# Patient Record
Sex: Female | Born: 1980 | Smoking: Never smoker
Health system: Southern US, Community
[De-identification: ages and names within clinical notes are randomized; demographics above are authoritative.]

---

## 2013-02-26 ENCOUNTER — Other Ambulatory Visit (HOSPITAL_COMMUNITY)
Admission: RE | Admit: 2013-02-26 | Discharge: 2013-02-26 | Disposition: A | Discharge status: Home / Self Care | Payer: BC Managed Care – PPO | Source: Ambulatory Visit | Attending: Obstetrics and Gynecology | Admitting: Obstetrics and Gynecology

## 2013-02-26 DIAGNOSIS — Z1151 Encounter for screening for human papillomavirus (HPV): Secondary | ICD-10-CM | POA: Insufficient documentation

## 2013-02-26 DIAGNOSIS — Z124 Encounter for screening for malignant neoplasm of cervix: Secondary | ICD-10-CM | POA: Insufficient documentation

## 2013-02-26 DIAGNOSIS — R87810 Cervical high risk human papillomavirus (HPV) DNA test positive: Secondary | ICD-10-CM | POA: Insufficient documentation

## 2014-03-08 ENCOUNTER — Other Ambulatory Visit (HOSPITAL_COMMUNITY)
Admission: RE | Admit: 2014-03-08 | Discharge: 2014-03-08 | Disposition: A | Discharge status: Home / Self Care | Payer: BLUE CROSS/BLUE SHIELD | Source: Ambulatory Visit | Attending: Obstetrics and Gynecology | Admitting: Obstetrics and Gynecology

## 2014-03-08 ENCOUNTER — Other Ambulatory Visit: Payer: Self-pay | Admitting: Obstetrics and Gynecology

## 2014-03-08 DIAGNOSIS — Z1151 Encounter for screening for human papillomavirus (HPV): Secondary | ICD-10-CM | POA: Diagnosis present

## 2014-03-08 DIAGNOSIS — Z01419 Encounter for gynecological examination (general) (routine) without abnormal findings: Secondary | ICD-10-CM | POA: Diagnosis not present

## 2014-03-10 LAB — CYTOLOGY - PAP

## 2015-03-20 ENCOUNTER — Other Ambulatory Visit (HOSPITAL_COMMUNITY)
Admission: RE | Admit: 2015-03-20 | Discharge: 2015-03-20 | Disposition: A | Discharge status: Home / Self Care | Payer: BLUE CROSS/BLUE SHIELD | Source: Ambulatory Visit | Attending: Obstetrics and Gynecology | Admitting: Obstetrics and Gynecology

## 2015-03-20 ENCOUNTER — Other Ambulatory Visit: Payer: Self-pay | Admitting: Obstetrics and Gynecology

## 2015-03-20 DIAGNOSIS — Z01419 Encounter for gynecological examination (general) (routine) without abnormal findings: Secondary | ICD-10-CM | POA: Diagnosis present

## 2015-03-20 DIAGNOSIS — Z1151 Encounter for screening for human papillomavirus (HPV): Secondary | ICD-10-CM | POA: Diagnosis present

## 2015-03-21 LAB — CYTOLOGY - PAP

## 2016-04-09 DIAGNOSIS — D2262 Melanocytic nevi of left upper limb, including shoulder: Secondary | ICD-10-CM | POA: Diagnosis not present

## 2016-04-09 DIAGNOSIS — D487 Neoplasm of uncertain behavior of other specified sites: Secondary | ICD-10-CM | POA: Diagnosis not present

## 2016-04-09 DIAGNOSIS — Z113 Encounter for screening for infections with a predominantly sexual mode of transmission: Secondary | ICD-10-CM | POA: Diagnosis not present

## 2016-04-09 DIAGNOSIS — R61 Generalized hyperhidrosis: Secondary | ICD-10-CM | POA: Diagnosis not present

## 2016-04-09 DIAGNOSIS — D485 Neoplasm of uncertain behavior of skin: Secondary | ICD-10-CM | POA: Diagnosis not present

## 2016-04-09 DIAGNOSIS — D225 Melanocytic nevi of trunk: Secondary | ICD-10-CM | POA: Diagnosis not present

## 2016-04-09 DIAGNOSIS — Z01411 Encounter for gynecological examination (general) (routine) with abnormal findings: Secondary | ICD-10-CM | POA: Diagnosis not present

## 2016-04-09 DIAGNOSIS — D2271 Melanocytic nevi of right lower limb, including hip: Secondary | ICD-10-CM | POA: Diagnosis not present

## 2016-04-09 DIAGNOSIS — D2261 Melanocytic nevi of right upper limb, including shoulder: Secondary | ICD-10-CM | POA: Diagnosis not present

## 2016-04-12 DIAGNOSIS — F900 Attention-deficit hyperactivity disorder, predominantly inattentive type: Secondary | ICD-10-CM | POA: Diagnosis not present

## 2016-08-30 DIAGNOSIS — N898 Other specified noninflammatory disorders of vagina: Secondary | ICD-10-CM | POA: Diagnosis not present

## 2016-10-04 DIAGNOSIS — F900 Attention-deficit hyperactivity disorder, predominantly inattentive type: Secondary | ICD-10-CM | POA: Diagnosis not present

## 2016-11-07 DIAGNOSIS — M5442 Lumbago with sciatica, left side: Secondary | ICD-10-CM | POA: Diagnosis not present

## 2016-11-07 DIAGNOSIS — M5136 Other intervertebral disc degeneration, lumbar region: Secondary | ICD-10-CM | POA: Diagnosis not present

## 2016-11-07 DIAGNOSIS — M62830 Muscle spasm of back: Secondary | ICD-10-CM | POA: Diagnosis not present

## 2016-11-07 DIAGNOSIS — M9903 Segmental and somatic dysfunction of lumbar region: Secondary | ICD-10-CM | POA: Diagnosis not present

## 2016-11-21 DIAGNOSIS — M5442 Lumbago with sciatica, left side: Secondary | ICD-10-CM | POA: Diagnosis not present

## 2016-11-21 DIAGNOSIS — M9903 Segmental and somatic dysfunction of lumbar region: Secondary | ICD-10-CM | POA: Diagnosis not present

## 2016-11-21 DIAGNOSIS — M5136 Other intervertebral disc degeneration, lumbar region: Secondary | ICD-10-CM | POA: Diagnosis not present

## 2016-11-21 DIAGNOSIS — M62830 Muscle spasm of back: Secondary | ICD-10-CM | POA: Diagnosis not present

## 2016-11-26 DIAGNOSIS — D485 Neoplasm of uncertain behavior of skin: Secondary | ICD-10-CM | POA: Diagnosis not present

## 2016-11-26 DIAGNOSIS — D2271 Melanocytic nevi of right lower limb, including hip: Secondary | ICD-10-CM | POA: Diagnosis not present

## 2016-11-30 DIAGNOSIS — M5136 Other intervertebral disc degeneration, lumbar region: Secondary | ICD-10-CM | POA: Diagnosis not present

## 2016-11-30 DIAGNOSIS — M62830 Muscle spasm of back: Secondary | ICD-10-CM | POA: Diagnosis not present

## 2016-11-30 DIAGNOSIS — M9903 Segmental and somatic dysfunction of lumbar region: Secondary | ICD-10-CM | POA: Diagnosis not present

## 2016-11-30 DIAGNOSIS — M5442 Lumbago with sciatica, left side: Secondary | ICD-10-CM | POA: Diagnosis not present

## 2017-02-11 DIAGNOSIS — M5442 Lumbago with sciatica, left side: Secondary | ICD-10-CM | POA: Diagnosis not present

## 2017-02-11 DIAGNOSIS — M62830 Muscle spasm of back: Secondary | ICD-10-CM | POA: Diagnosis not present

## 2017-02-11 DIAGNOSIS — M5136 Other intervertebral disc degeneration, lumbar region: Secondary | ICD-10-CM | POA: Diagnosis not present

## 2017-02-11 DIAGNOSIS — M9903 Segmental and somatic dysfunction of lumbar region: Secondary | ICD-10-CM | POA: Diagnosis not present

## 2017-03-28 DIAGNOSIS — F900 Attention-deficit hyperactivity disorder, predominantly inattentive type: Secondary | ICD-10-CM | POA: Diagnosis not present

## 2017-04-16 DIAGNOSIS — Z01411 Encounter for gynecological examination (general) (routine) with abnormal findings: Secondary | ICD-10-CM | POA: Diagnosis not present

## 2017-09-19 DIAGNOSIS — F900 Attention-deficit hyperactivity disorder, predominantly inattentive type: Secondary | ICD-10-CM | POA: Diagnosis not present

## 2018-03-13 DIAGNOSIS — F900 Attention-deficit hyperactivity disorder, predominantly inattentive type: Secondary | ICD-10-CM | POA: Diagnosis not present

## 2018-04-21 ENCOUNTER — Other Ambulatory Visit: Payer: Self-pay | Admitting: Obstetrics and Gynecology

## 2018-04-21 ENCOUNTER — Other Ambulatory Visit (HOSPITAL_COMMUNITY)
Admission: RE | Admit: 2018-04-21 | Discharge: 2018-04-21 | Disposition: A | Discharge status: Home / Self Care | Payer: BLUE CROSS/BLUE SHIELD | Source: Ambulatory Visit | Attending: Obstetrics and Gynecology | Admitting: Obstetrics and Gynecology

## 2018-04-21 DIAGNOSIS — Z01419 Encounter for gynecological examination (general) (routine) without abnormal findings: Secondary | ICD-10-CM | POA: Diagnosis not present

## 2018-04-24 LAB — CYTOLOGY - PAP
Diagnosis: NEGATIVE
HPV (WINDOPATH): NOT DETECTED

## 2018-07-27 DIAGNOSIS — Z3041 Encounter for surveillance of contraceptive pills: Secondary | ICD-10-CM | POA: Diagnosis not present

## 2018-08-03 DIAGNOSIS — F4323 Adjustment disorder with mixed anxiety and depressed mood: Secondary | ICD-10-CM | POA: Diagnosis not present

## 2018-08-17 DIAGNOSIS — F4323 Adjustment disorder with mixed anxiety and depressed mood: Secondary | ICD-10-CM | POA: Diagnosis not present

## 2018-09-04 DIAGNOSIS — F900 Attention-deficit hyperactivity disorder, predominantly inattentive type: Secondary | ICD-10-CM | POA: Diagnosis not present

## 2018-09-14 DIAGNOSIS — F4323 Adjustment disorder with mixed anxiety and depressed mood: Secondary | ICD-10-CM | POA: Diagnosis not present

## 2018-10-05 DIAGNOSIS — F4323 Adjustment disorder with mixed anxiety and depressed mood: Secondary | ICD-10-CM | POA: Diagnosis not present

## 2018-10-08 ENCOUNTER — Ambulatory Visit: Payer: BLUE CROSS/BLUE SHIELD | Admitting: Sports Medicine

## 2018-10-22 DIAGNOSIS — B373 Candidiasis of vulva and vagina: Secondary | ICD-10-CM | POA: Diagnosis not present

## 2018-10-22 DIAGNOSIS — N898 Other specified noninflammatory disorders of vagina: Secondary | ICD-10-CM | POA: Diagnosis not present

## 2018-12-30 ENCOUNTER — Other Ambulatory Visit: Payer: Self-pay

## 2018-12-30 DIAGNOSIS — Z20822 Contact with and (suspected) exposure to covid-19: Secondary | ICD-10-CM

## 2018-12-31 LAB — NOVEL CORONAVIRUS, NAA: SARS-CoV-2, NAA: NOT DETECTED

## 2019-01-13 ENCOUNTER — Other Ambulatory Visit: Payer: Self-pay

## 2019-01-13 DIAGNOSIS — Z20822 Contact with and (suspected) exposure to covid-19: Secondary | ICD-10-CM

## 2019-01-14 ENCOUNTER — Ambulatory Visit: Payer: BC Managed Care – PPO | Admitting: Sports Medicine

## 2019-01-14 ENCOUNTER — Ambulatory Visit: Payer: Self-pay

## 2019-01-14 ENCOUNTER — Other Ambulatory Visit: Payer: Self-pay

## 2019-01-14 VITALS — BP 106/64 | Ht 61.0 in | Wt 137.0 lb

## 2019-01-14 DIAGNOSIS — M7631 Iliotibial band syndrome, right leg: Secondary | ICD-10-CM

## 2019-01-14 DIAGNOSIS — M25561 Pain in right knee: Secondary | ICD-10-CM

## 2019-01-14 DIAGNOSIS — M7630 Iliotibial band syndrome, unspecified leg: Secondary | ICD-10-CM | POA: Insufficient documentation

## 2019-01-14 NOTE — Assessment & Plan Note (Signed)
Tenderness of ITB along distal insertion overlying femoral condyle worse with squatting/stairs and notable fluid surrounding ITB on ultrasound consistent with iliotibial band syndrome.  Reassuringly no joint effusion or abnormalities visualized of both menisci, making intra articular pathology much less likely.  Recommended conservative therapy including daily ITB/hip strengthening/stretching exercises, modifying activity/lifting to avoid aggravating movements, ice frequently, and Tylenol/ibuprofen as needed.  Would like her to follow-up in 4 weeks or sooner if needed.

## 2019-01-14 NOTE — Progress Notes (Signed)
Tracy Cruz - 38 y.o. female MRN [id number]  Date of birth: September 23, 1980  SUBJECTIVE:   CC: Right knee pain  HPI: Tracy Cruz is a healthy 38 year old female presenting for evaluation of anterior lateral right knee pain.  Started insidiously in March 2020, has been off and on since then.  No prior injury or trauma to this area.  Notices the throbbing pain mainly with squatting, going up and down stairs.  She lifts weights on a regular basis and tries to run/walk frequently.  Notices a cyclic cycle if she tries to increase her workout regimen that she will need to take several weeks off to recover and so forth.  Endorses more nonpainful popping, however denies any locking or knee giving out.  No associated swelling, numbness/tingling, or weakness.  She has been wearing a knee compression sleeve and kinesiotaping that has helped somewhat.  She is intermittently tried icing and Tylenol/ibuprofen with some relief.    ROS: No unexpected weight loss, fever, chills, swelling, instability, muscle pain, numbness/tingling, redness, otherwise see HPI   PMHx, surgical, family, and social history and medications reviewed.  PHYSICAL EXAM:  VS: BP:106/64  HR: bpm  TEMP: ( )  RESP:   HT:5\' 1"  (154.9 cm)   WT:137 lb (62.1 kg)  BMI:25.9 PHYSICAL EXAM: Gen: NAD, alert, cooperative with exam, well-appearing Resp: non-labored Skin: no rashes, normal turgor  Neuro: no gross deficits.  Psych:  alert and oriented  Right knee: - Inspection: no gross deformity. No swelling/effusion, erythema or bruising.  - Palpation: TTP along distal ITB at insertion site and overlying lateral femoral condyle - ROM: full active ROM with flexion and extension in knee and hip - Strength: 5/5 strength including through hip flexion/extension, abduction, abduction, IR/ER - Neuro/vasc: NV intact - Special Tests: - LIGAMENTS: negative anterior and posterior drawer, negative Lachman's, no MCL or LCL laxity  -- MENISCUS:  negative McMurray's, negative Thessaly  -- PF JOINT: nml patellar mobility bilaterally.  negative patellar grind --Modified Thomas test with slight abduction of hip and external rotation of foot and compared to left  Left knee: - Inspection: no gross deformity. No swelling/effusion, erythema or bruising. Skin intact - Palpation: no TTP - ROM: full active ROM with flexion and extension in knee and hip - Strength: 5/5 strength - Neuro/vasc: NV intact - Special Tests: - LIGAMENTS: negative anterior and posterior drawer, no MCL or LCL laxity  -- MENISCUS: negative McMurray's  Hips: Normal ROM bilaterally.  Limited ultrasound of right knee revealing no effusion within the suprapatellar pouch.  Lateral and medial meniscus visualized and normal.  Notable hyperechoic regions/fluid surrounding distal portion of ITB.  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  ASSESSMENT & PLAN:   ITB syndrome Tenderness of ITB along distal insertion overlying femoral condyle worse with squatting/stairs and notable fluid surrounding ITB on ultrasound consistent with iliotibial band syndrome.  Reassuringly no joint effusion or abnormalities visualized of both menisci, making intra articular pathology much less likely.  Recommended conservative therapy including daily ITB/hip strengthening/stretching exercises, modifying activity/lifting to avoid aggravating movements, ice frequently, and Tylenol/ibuprofen as needed.  Would like her to follow-up in 4 weeks or sooner if needed.  Follow-up in 4 weeks to assess progress or sooner if needed.  Patriciaann Clan, DO  Family Medicine PGY-2   Patient seen and evaluated with the resident.  I agree with the above plan of care.  Treatment as above and follow-up in 4 weeks.  If symptoms persist consider merits of further  diagnostic imaging specifically to rule out an acute lateral meniscal tear.

## 2019-01-14 NOTE — Patient Instructions (Signed)
It was a wonderful meeting you today!  We believe you have iliotibial band syndrome, we have given you some exercises and stretches to start doing on a daily basis to help with this.  You can continue lifting, however please modify if you are having any pain in that area.  Recommend icing this region few times daily when possible for about 15 minutes.  You can also use Tylenol and/or ibuprofen as needed.  Would like to see you back in 4 weeks or sooner if needed.

## 2019-01-15 LAB — NOVEL CORONAVIRUS, NAA: SARS-CoV-2, NAA: NOT DETECTED

## 2019-02-11 ENCOUNTER — Other Ambulatory Visit: Payer: Self-pay

## 2019-02-11 ENCOUNTER — Ambulatory Visit: Payer: BC Managed Care – PPO | Admitting: Sports Medicine

## 2019-02-11 DIAGNOSIS — M7632 Iliotibial band syndrome, left leg: Secondary | ICD-10-CM

## 2019-02-11 NOTE — Assessment & Plan Note (Addendum)
Right sided: 60% improved with at home hip abductor strength exercises and over-the-counter ibuprofen Tylenol for the last 4 weeks.  Advice is to continue with at home exercises as patient does not want formal therapy at this time, we do not think MRI imaging is necessary she seems to be improving satisfactorily, has been continuing with weightbearing knee bending exercises(squat/lunge/dead lift) and is advised to stop this for 1 month to allow the ITB more time to heal.  May follow-up in 4 weeks (telemedicine acceptable if she is still improving)

## 2019-02-11 NOTE — Progress Notes (Signed)
    Subjective:  Tracy Cruz is a 38 y.o. female who presents to the [facility] today with a chief complaint of ITB syndrome follow-up.   HPI: Patient has had right lateral knee pain since March, seen approximately 4 weeks ago in the sports clinic and diagnosed with ITB syndrome.  Ultrasound showed effusion at that point and no suprapatellar fluid.  Since then she has been doing her therapeutic exercises particularly hip abductors every day, she has reduced weight but has not stopped weightbearing knee bending exercises such as squats/deadlifts/lunge.  On leg days which is once per week she says she does have tenderness on that side and has to do "extra ice ".  She has been using over-the-counter ibuprofen and Tylenol for pain.  She does not feel that there is been a significant improvement in her hip abductor strength but feels that overall her knee pain is improved 60%.  Feels it mostly lateral over the femoral condyle and some occasionally suprapatellar.  Occasionally feels a small click when doing her lateral and cross legged step up exercises but never has any locking in her knee.   Objective:  Physical Exam: BP 106/70   Ht 5\' 1"  (1.549 m)   Wt 135 lb (61.2 kg)   BMI 25.51 kg/m   Gen: NAD, athletic  Pulm: NWOB, no cough MSK: Good hip abductor strength bilaterally, good running gait without significant pronation/supination Knee:  Inspection: No visual edema or deformity Palpation: No palpable erythema or crepitus, some mild point tenderness over distal insertion of ITB ROM: Full range of motion with no deficit Strength: Good strength in extension and in flexion Stability: No joint laxity or instability Special tests: Mild tenderness with varus stress on lateral knee but no laxity, negative valgus, negative anterior posterior drawer, negative Thessaly, negative Apley, negative McMurray Neurovascular: No neurovascular deficits noted Skin: warm, dry Neuro: grossly normal, moves all  extremities Psych: Normal affect and thought content  No results found for this or any previous visit (from the past 72 hour(s)).   Assessment/Plan:  ITB syndrome Right sided: 60% improved with at home hip abductor strength exercises and over-the-counter ibuprofen Tylenol for the last 4 weeks.  Advice is to continue with at home exercises as patient does not want formal therapy at this time, we do not think MRI imaging is necessary she seems to be improving satisfactorily, has been continuing with weightbearing knee bending exercises(squat/lunge/dead lift) and is advised to stop this for 1 month to allow the ITB more time to heal.  May follow-up in 4 weeks (telemedicine acceptable if she is still improving)    Sherene Sires, DO [place] - PGY3 02/11/2019 11:12 AM   Patient seen and evaluated with the resident.  I agree with the above plan of care.  Patient is improving, albeit slowly.  I did discuss possibility of formal physical therapy but she would like to hold on that for now.  She understands that if symptoms plateau or worsen then we could reconsider formal physical therapy prior to ordering an MRI.  We will tentatively schedule a follow-up visit for 4 weeks from now which the patient may feel free to cancel if she continues to improve.

## 2019-02-11 NOTE — Patient Instructions (Signed)
It was a pleasure to see you today!    Today we talked about your progress.  It is a good thing that you have been doing your therapy exercises, particularly your hip abductor exercises.   Based off your progress and exam we still believe this is likely ITB syndrome.  We are glad that you think that you have improved 60% so far, we think that you can continue to do so by doing these exercises at home.  If you get to the point where you want formal physical therapy, let us know and we can refer you.  We do want to suggest you consider withholding from knee bending weight bearing exercises for a month, continue the ice and the home therapy exercises that we prescribed for before.  You can check back in with [place] in a month, if everything is going well that can be a telemedicine visit.  Please let us know if something get significantly worse before then.   Please check-out at the front desk before leaving the clinic.     Best,  Dr. Sherene Sires FAMILY MEDICINE RESIDENT - PGY3 02/11/2019 10:59 AM

## 2019-03-22 ENCOUNTER — Ambulatory Visit: Payer: BC Managed Care – PPO | Attending: Internal Medicine

## 2019-03-22 DIAGNOSIS — Z20822 Contact with and (suspected) exposure to covid-19: Secondary | ICD-10-CM | POA: Diagnosis not present

## 2019-03-23 LAB — NOVEL CORONAVIRUS, NAA: SARS-CoV-2, NAA: NOT DETECTED

## 2019-04-06 DIAGNOSIS — F900 Attention-deficit hyperactivity disorder, predominantly inattentive type: Secondary | ICD-10-CM | POA: Diagnosis not present

## 2019-04-26 DIAGNOSIS — Z01419 Encounter for gynecological examination (general) (routine) without abnormal findings: Secondary | ICD-10-CM | POA: Diagnosis not present

## 2019-05-07 ENCOUNTER — Ambulatory Visit: Payer: BC Managed Care – PPO | Attending: Internal Medicine

## 2019-05-07 DIAGNOSIS — Z23 Encounter for immunization: Secondary | ICD-10-CM

## 2019-05-14 ENCOUNTER — Other Ambulatory Visit: Payer: Self-pay | Admitting: Plastic Surgery

## 2019-05-14 DIAGNOSIS — Z1231 Encounter for screening mammogram for malignant neoplasm of breast: Secondary | ICD-10-CM

## 2019-05-17 ENCOUNTER — Ambulatory Visit: Payer: BC Managed Care – PPO

## 2019-05-18 ENCOUNTER — Ambulatory Visit: Payer: BC Managed Care – PPO

## 2019-06-01 ENCOUNTER — Ambulatory Visit: Payer: BC Managed Care – PPO | Attending: Internal Medicine

## 2019-06-01 DIAGNOSIS — Z23 Encounter for immunization: Secondary | ICD-10-CM

## 2019-06-01 NOTE — Progress Notes (Signed)
   Covid-19 Vaccination Clinic  Name:  Tracy Cruz    MRN: [id number] DOB: 10-14-1980  06/01/2019  Tracy Cruz was observed post Covid-19 immunization for 15 minutes without incident. She was provided with Vaccine Information Sheet and instruction to access the V-Safe system.   Tracy Cruz was instructed to call 911 with any severe reactions post vaccine: Marland Kitchen Difficulty breathing  . Swelling of face and throat  . A fast heartbeat  . A bad rash all over body  . Dizziness and weakness   Immunizations Administered    Name Date Dose VIS Date Route   Pfizer COVID-19 Vaccine 06/01/2019  9:19 AM 0.3 mL 02/05/2019 Intramuscular   Manufacturer: [employer], [employer]   Lot: [id number]   NDC: [address]-9

## 2019-07-12 ENCOUNTER — Other Ambulatory Visit: Payer: Self-pay

## 2019-07-12 ENCOUNTER — Ambulatory Visit
Admission: RE | Admit: 2019-07-12 | Discharge: 2019-07-12 | Disposition: A | Discharge status: Home / Self Care | Payer: BC Managed Care – PPO | Source: Ambulatory Visit | Attending: Plastic Surgery | Admitting: Plastic Surgery

## 2019-07-12 DIAGNOSIS — Z1231 Encounter for screening mammogram for malignant neoplasm of breast: Secondary | ICD-10-CM | POA: Diagnosis not present

## 2019-07-22 DIAGNOSIS — R635 Abnormal weight gain: Secondary | ICD-10-CM | POA: Diagnosis not present

## 2019-09-29 DIAGNOSIS — F900 Attention-deficit hyperactivity disorder, predominantly inattentive type: Secondary | ICD-10-CM | POA: Diagnosis not present

## 2020-01-01 DIAGNOSIS — Z20822 Contact with and (suspected) exposure to covid-19: Secondary | ICD-10-CM | POA: Diagnosis not present

## 2020-10-23 ENCOUNTER — Ambulatory Visit (INDEPENDENT_AMBULATORY_CARE_PROVIDER_SITE_OTHER): Payer: BC Managed Care – PPO | Admitting: Internal Medicine

## 2021-04-10 DIAGNOSIS — M25571 Pain in right ankle and joints of right foot: Secondary | ICD-10-CM | POA: Diagnosis not present

## 2021-05-02 DIAGNOSIS — M25571 Pain in right ankle and joints of right foot: Secondary | ICD-10-CM | POA: Diagnosis not present

## 2021-05-04 DIAGNOSIS — R5383 Other fatigue: Secondary | ICD-10-CM | POA: Diagnosis not present

## 2021-05-04 DIAGNOSIS — Z01419 Encounter for gynecological examination (general) (routine) without abnormal findings: Secondary | ICD-10-CM | POA: Diagnosis not present

## 2021-05-04 DIAGNOSIS — M25571 Pain in right ankle and joints of right foot: Secondary | ICD-10-CM | POA: Diagnosis not present

## 2021-05-04 DIAGNOSIS — E559 Vitamin D deficiency, unspecified: Secondary | ICD-10-CM | POA: Diagnosis not present

## 2021-05-06 IMAGING — MG DIGITAL SCREENING BILAT W/ TOMO W/ CAD
8 series · 8 of 24 positions shown · non-contrast
Comparison: None.

CLINICAL DATA: Screening.

EXAM:
DIGITAL SCREENING BILATERAL MAMMOGRAM WITH TOMO AND CAD

[L CC synth-2D]
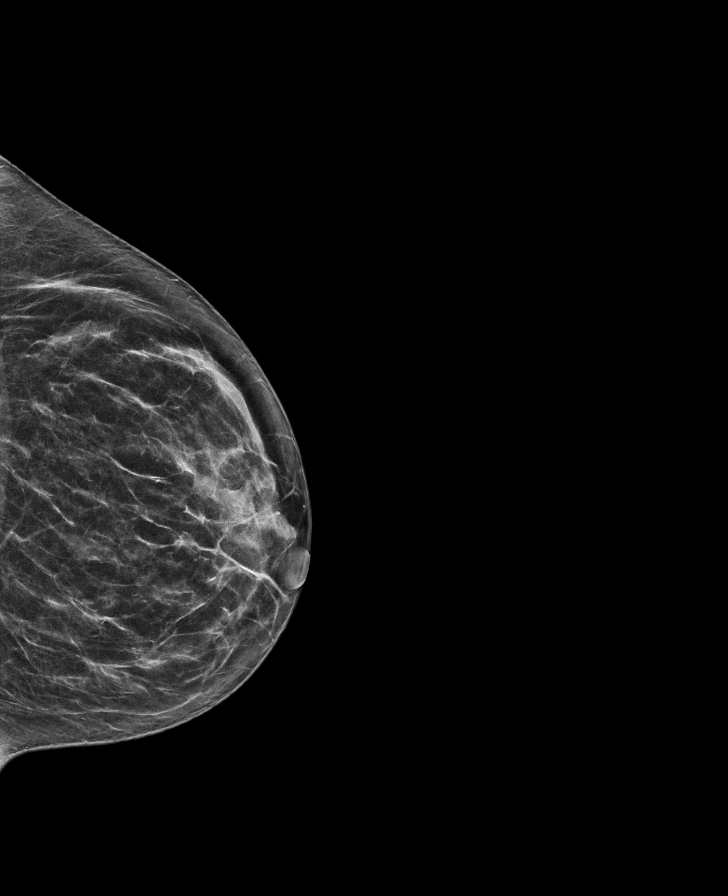

[R CC synth-2D]
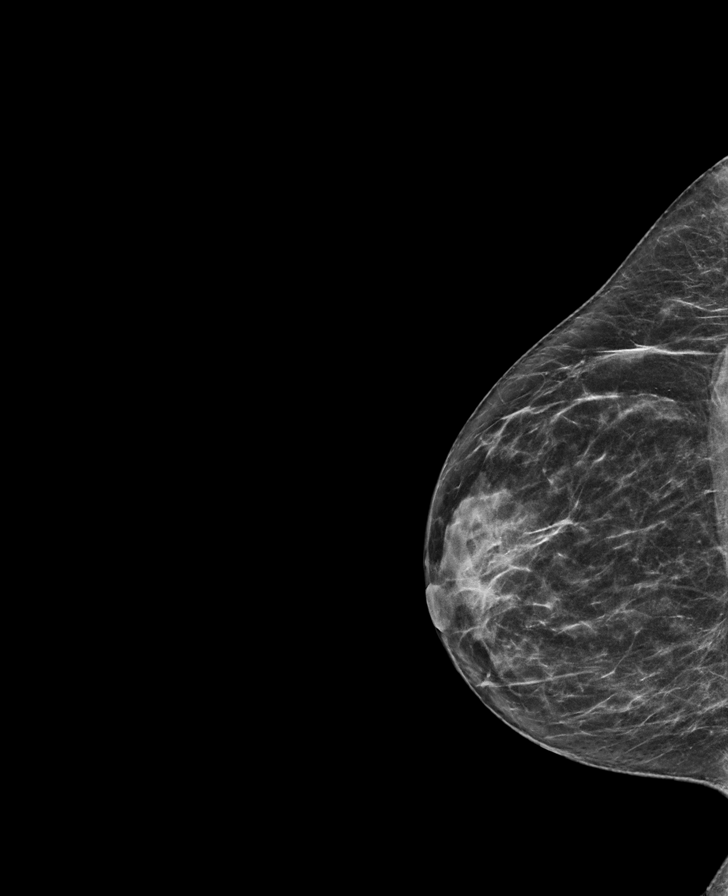

[R MLO synth-2D]
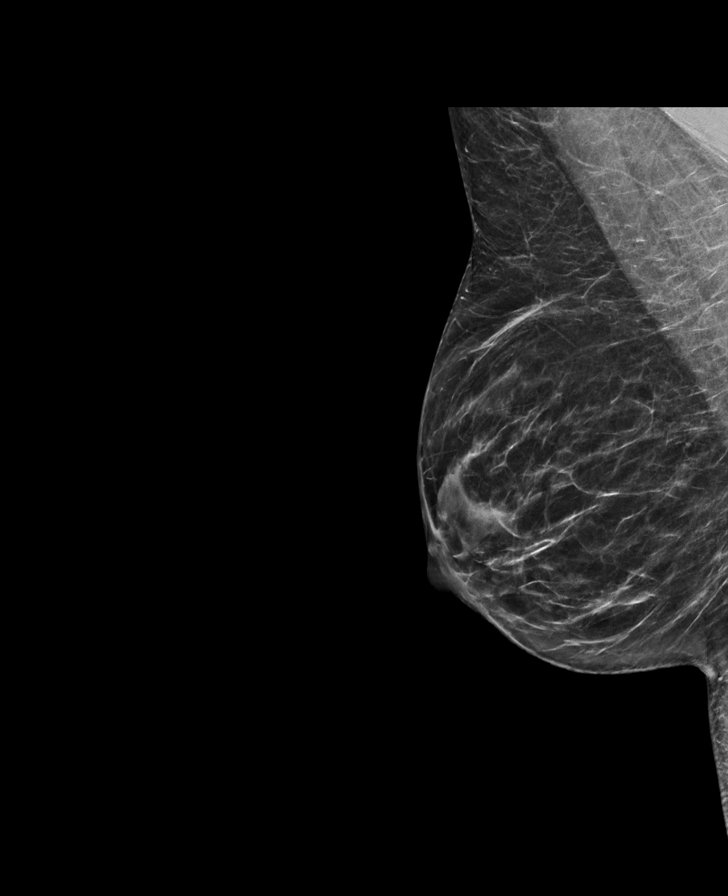

[L MLO synth-2D]
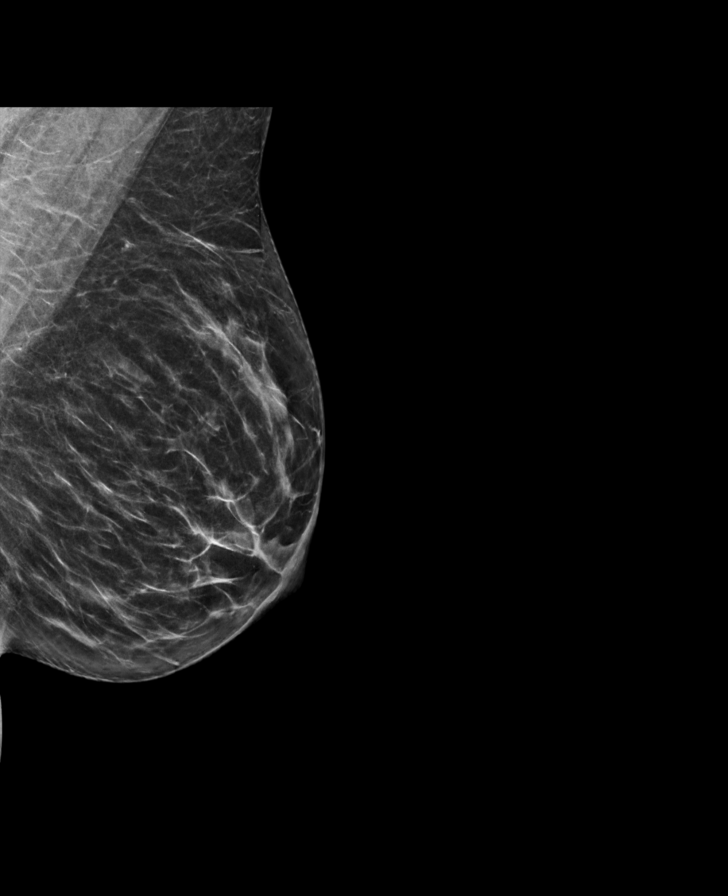

[L MLO tomo · tomo slice 33/66.0]
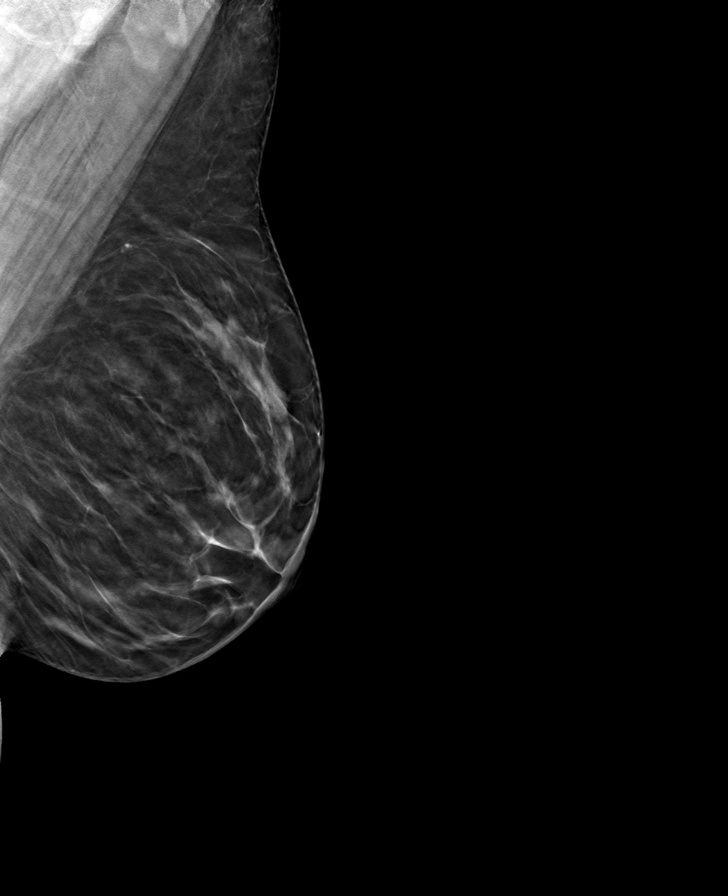

[R CC tomo · tomo slice 31/60.0]
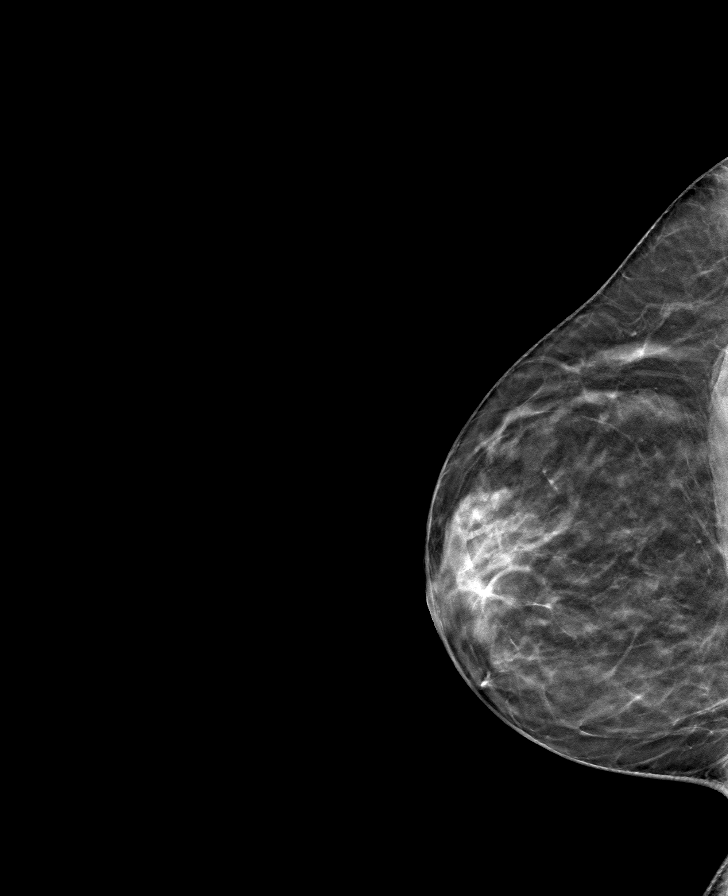

[L CC tomo · tomo slice 31/62.0]
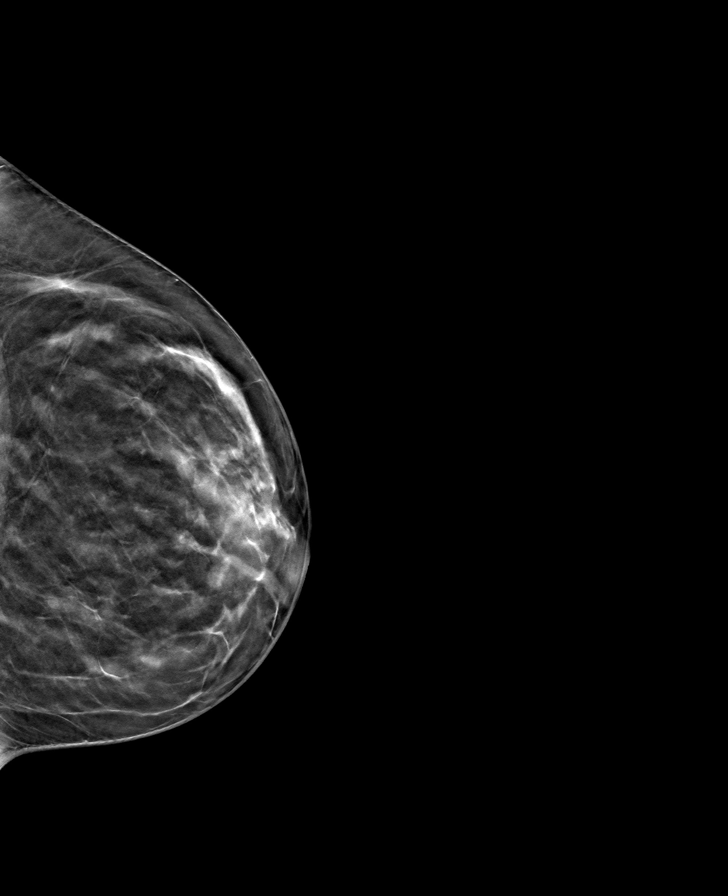

[R MLO tomo · tomo slice 33/66.0]
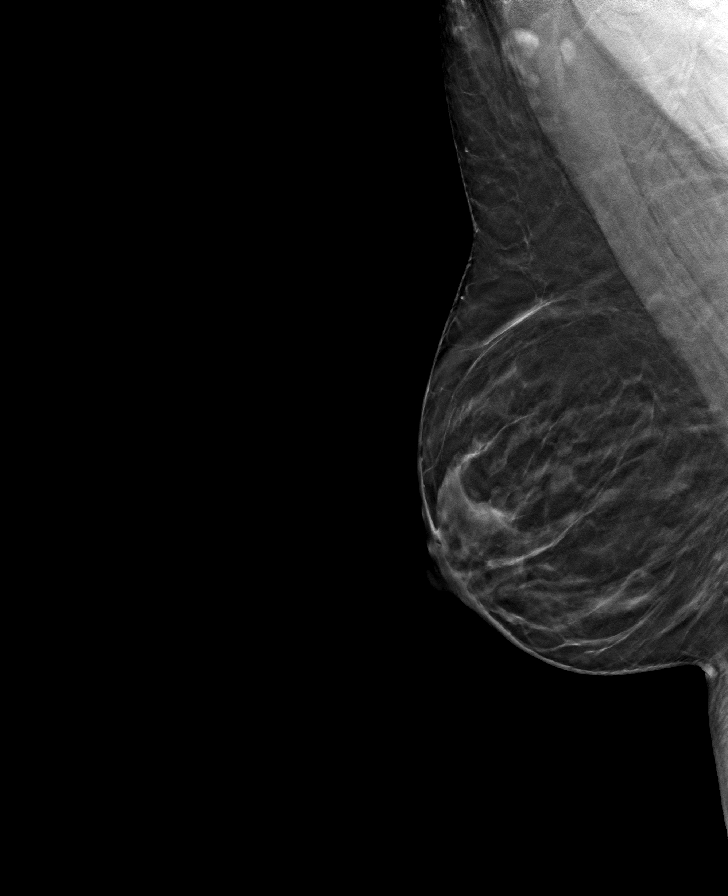

[8 of 24 positions shown; findings below may reference images not displayed]

ACR Breast Density Category b: There are scattered areas of
fibroglandular density.
FINDINGS: There are no findings suspicious for malignancy. Images were
processed with CAD.
IMPRESSION: No mammographic evidence of malignancy. A result letter of this
screening mammogram will be mailed directly to the patient.

RECOMMENDATION:
Screening mammogram at age 40. (Code:[id number]-[id number])

BI-RADS CATEGORY  1: Negative.

## 2021-05-09 DIAGNOSIS — M25571 Pain in right ankle and joints of right foot: Secondary | ICD-10-CM | POA: Diagnosis not present

## 2021-06-26 ENCOUNTER — Encounter (HOSPITAL_COMMUNITY): Payer: Self-pay

## 2021-06-26 ENCOUNTER — Other Ambulatory Visit: Payer: Self-pay

## 2021-06-26 ENCOUNTER — Ambulatory Visit (HOSPITAL_COMMUNITY)
Admission: RE | Admit: 2021-06-26 | Discharge: 2021-06-26 | Disposition: A | Discharge status: Home / Self Care | Payer: BC Managed Care – PPO | Source: Ambulatory Visit | Attending: Physician Assistant | Admitting: Physician Assistant

## 2021-06-26 VITALS — BP 118/54 | HR 86 | Temp 98.1°F | Resp 18

## 2021-06-26 DIAGNOSIS — H1032 Unspecified acute conjunctivitis, left eye: Secondary | ICD-10-CM

## 2021-06-26 DIAGNOSIS — J019 Acute sinusitis, unspecified: Secondary | ICD-10-CM | POA: Diagnosis not present

## 2021-06-26 MED ORDER — AMOXICILLIN-POT CLAVULANATE 875-125 MG PO TABS: 1.0000 | ORAL_TABLET | Freq: Two times a day (BID) | ORAL | 0 refills | Status: DC

## 2021-06-26 MED ORDER — BENZONATATE 100 MG PO CAPS: 100.0000 mg | ORAL_CAPSULE | Freq: Three times a day (TID) | ORAL | 0 refills | Status: DC

## 2021-06-26 MED ORDER — OFLOXACIN 0.3 % OP SOLN: 2.0000 [drp] | Freq: Four times a day (QID) | OPHTHALMIC | 0 refills | Status: AC

## 2021-06-26 NOTE — ED Triage Notes (Addendum)
Sore throat started 7 days ago, loss of voice, cough and left eye redness.  Patient has been traveling by plane recently.  Home covid test was negative ?

## 2021-06-26 NOTE — ED Provider Notes (Signed)
?[facility] ? ? ? ?CSN: [id number] ?Arrival date & time: 06/26/21  1841 ? ? ?  ? ?History   ?Chief Complaint ?Chief Complaint  ?Patient presents with  ? Eye Problem  ?  Pink eye in addition to congestion and cough which has been going on for 5 days - Entered by patient  ? Appointment  ?  1900  ? ? ?HPI ?Tracy Cruz is a 41 y.o. female.  ? ?Pt complains of one week of congestion, cough, voice hoarseness.  She reports COVID test at home was negative.  Recent flight caused sinus pressure to feel worse.  She reports left eye redness and drainage that started a yesterday.  Reports she wears contacts.  She denies fever, chills, n/v, body aches.  She has tried sudafed with minimal relief.  ? ? ?History reviewed. No pertinent past medical history. ? ?Patient Active Problem List  ? Diagnosis Date Noted  ? ITB syndrome 01/14/2019  ? ? ?History reviewed. No pertinent surgical history. ? ?OB History   ?No obstetric history on file. ?  ? ? ? ?Home Medications   ? ?Prior to Admission medications   ?Medication Sig Start Date End Date Taking? Authorizing Provider  ?amoxicillin-clavulanate (AUGMENTIN) 875-125 MG tablet Take 1 tablet by mouth every 12 (twelve) hours. 06/26/21  Yes Ward, Tylene FantasiaJessica Z, PA-C  ?benzonatate (TESSALON) 100 MG capsule Take 1 capsule (100 mg total) by mouth every 8 (eight) hours. 06/26/21  Yes Ward, Tylene FantasiaJessica Z, PA-C  ?ofloxacin (OCUFLOX) 0.3 % ophthalmic solution Place 2 drops into both eyes 4 (four) times daily for 7 days. 06/26/21 07/03/21 Yes Ward, Tylene FantasiaJessica Z, PA-C  ?ADDERALL XR 20 MG 24 hr capsule Take 20 mg by mouth every morning. 12/25/18   [provider]  ?amphetamine-dextroamphetamine (ADDERALL) 20 MG tablet Take 20 mg by mouth daily. 12/25/18   [provider]  ?traZODone (DESYREL) 50 MG tablet Take 50-100 mg by mouth at bedtime. 09/16/18   [provider]  ? ? ?Family History ?Family History  ?Problem Relation Age of Onset  ? Healthy Mother   ? Healthy Father    ? ? ?Social History ?Social History  ? ?Tobacco Use  ? Smoking status: Never  ? Smokeless tobacco: Never  ?Vaping Use  ? Vaping Use: Some days  ?Substance Use Topics  ? Alcohol use: Yes  ? Drug use: Never  ? ? ? ?Allergies   ?Patient has no known allergies. ? ? ?Review of Systems ?Review of Systems  ?Constitutional:  Negative for chills and fever.  ?HENT:  Positive for congestion, postnasal drip and sinus pressure. Negative for ear pain and sore throat.   ?Eyes:  Positive for discharge and redness. Negative for pain and visual disturbance.  ?Respiratory:  Positive for cough. Negative for shortness of breath.   ?Cardiovascular:  Negative for chest pain and palpitations.  ?Gastrointestinal:  Negative for abdominal pain and vomiting.  ?Genitourinary:  Negative for dysuria and hematuria.  ?Musculoskeletal:  Negative for arthralgias and back pain.  ?Skin:  Negative for color change and rash.  ?Neurological:  Negative for seizures and syncope.  ?All other systems reviewed and are negative. ? ? ?Physical Exam ?Triage Vital Signs ?ED Triage Vitals  ?Enc Vitals Group  ?   BP 06/26/21 1913 (!) 118/54  ?   Pulse Rate 06/26/21 1913 86  ?   Resp 06/26/21 1913 18  ?   Temp 06/26/21 1913 98.1 ?F (36.7 ?C)  ?   Temp Source 06/26/21 1913  Oral  ?   SpO2 06/26/21 1913 100 %  ?   Weight --   ?   Height --   ?   Head Circumference --   ?   Peak Flow --   ?   Pain Score 06/26/21 1910 0  ?   Pain Loc --   ?   Pain Edu? --   ?   Excl. in GC? --   ? ?No data found. ? ?Updated Vital Signs ?BP (!) 118/54 (BP Location: Right Arm)   Pulse 86   Temp 98.1 ?F (36.7 ?C) (Oral)   Resp 18   LMP 06/26/2021   SpO2 100%  ? ?Visual Acuity ?Right Eye Distance:   ?Left Eye Distance:   ?Bilateral Distance:   ? ?Right Eye Near:   ?Left Eye Near:    ?Bilateral Near:    ? ?Physical Exam ?Vitals and nursing note reviewed.  ?Constitutional:   ?   General: She is not in acute distress. ?   Appearance: She is well-developed.  ?HENT:  ?   Head: Normocephalic  and atraumatic.  ?   Comments: Voice hoarseness  ?Eyes:  ?   Conjunctiva/sclera:  ?   Right eye: Right conjunctiva is injected. Exudate present.  ?Cardiovascular:  ?   Rate and Rhythm: Normal rate and regular rhythm.  ?   Heart sounds: No murmur heard. ?Pulmonary:  ?   Effort: Pulmonary effort is normal. No respiratory distress.  ?   Breath sounds: Normal breath sounds.  ?Abdominal:  ?   Palpations: Abdomen is soft.  ?   Tenderness: There is no abdominal tenderness.  ?Musculoskeletal:     ?   General: No swelling.  ?   Cervical back: Neck supple.  ?Skin: ?   General: Skin is warm and dry.  ?   Capillary Refill: Capillary refill takes less than 2 seconds.  ?Neurological:  ?   Mental Status: She is alert.  ?Psychiatric:     ?   Mood and Affect: Mood normal.  ? ? ? ?UC Treatments / Results  ?Labs ?(all labs ordered are listed, but only abnormal results are displayed) ?Labs Reviewed - No data to display ? ?EKG ? ? ?Radiology ?No results found. ? ?Procedures ?Procedures (including critical care time) ? ?Medications Ordered in UC ?Medications - No data to display ? ?Initial Impression / Assessment and Plan / UC Course  ?I have reviewed the triage vital signs and the nursing notes. ? ?Pertinent labs & imaging results that were available during my care of the patient were reviewed by me and considered in my medical decision making (see chart for details). ? ?  ? ?Acute sinusitis, antibiotic prescribed.  Supportive care discussed.   ? ?Bacterial conjunctivitis, antibiotic drops prescribed.  Supportive care discussed.  ?Final Clinical Impressions(s) / UC Diagnoses  ? ?Final diagnoses:  ?Acute bacterial conjunctivitis of left eye  ?Acute non-recurrent sinusitis, unspecified location  ? ? ? ?Discharge Instructions   ? ?  ?Recommend Flonase daily ?Recommend Delsym for cough ?Use eye drops as directed, discontinue contacts and eye makeup until drainage/crusting resolves ?Take antibiotic as prescribed ?Can take Tessalon pearls as  needed every 8 hours for cough.  ? ? ?ED Prescriptions   ? ? Medication Sig Dispense Auth. Provider  ? ofloxacin (OCUFLOX) 0.3 % ophthalmic solution Place 2 drops into both eyes 4 (four) times daily for 7 days. 5 mL Ward, Tylene Fantasia, PA-C  ? benzonatate (TESSALON) 100 MG capsule Take 1  capsule (100 mg total) by mouth every 8 (eight) hours. 21 capsule Ward, Tylene Fantasia, PA-C  ? amoxicillin-clavulanate (AUGMENTIN) 875-125 MG tablet Take 1 tablet by mouth every 12 (twelve) hours. 14 tablet Ward, Tylene Fantasia, PA-C  ? ?  ? ?PDMP not reviewed this encounter. ?  ?Ward, Tylene Fantasia, PA-C ?06/26/21 1949 ? ?

## 2021-06-26 NOTE — Discharge Instructions (Signed)
Recommend Flonase daily ?Recommend Delsym for cough ?Use eye drops as directed, discontinue contacts and eye makeup until drainage/crusting resolves ?Take antibiotic as prescribed ?Can take Tessalon pearls as needed every 8 hours for cough.  ?

## 2021-09-13 DIAGNOSIS — M62830 Muscle spasm of back: Secondary | ICD-10-CM | POA: Diagnosis not present

## 2021-09-13 DIAGNOSIS — M9906 Segmental and somatic dysfunction of lower extremity: Secondary | ICD-10-CM | POA: Diagnosis not present

## 2021-09-13 DIAGNOSIS — M5441 Lumbago with sciatica, right side: Secondary | ICD-10-CM | POA: Diagnosis not present

## 2021-09-13 DIAGNOSIS — M9903 Segmental and somatic dysfunction of lumbar region: Secondary | ICD-10-CM | POA: Diagnosis not present

## 2021-09-24 DIAGNOSIS — M9903 Segmental and somatic dysfunction of lumbar region: Secondary | ICD-10-CM | POA: Diagnosis not present

## 2021-09-24 DIAGNOSIS — M62830 Muscle spasm of back: Secondary | ICD-10-CM | POA: Diagnosis not present

## 2021-09-24 DIAGNOSIS — M5441 Lumbago with sciatica, right side: Secondary | ICD-10-CM | POA: Diagnosis not present

## 2021-09-24 DIAGNOSIS — M9906 Segmental and somatic dysfunction of lower extremity: Secondary | ICD-10-CM | POA: Diagnosis not present

## 2021-10-11 DIAGNOSIS — M9903 Segmental and somatic dysfunction of lumbar region: Secondary | ICD-10-CM | POA: Diagnosis not present

## 2021-10-11 DIAGNOSIS — M9906 Segmental and somatic dysfunction of lower extremity: Secondary | ICD-10-CM | POA: Diagnosis not present

## 2021-10-11 DIAGNOSIS — M5441 Lumbago with sciatica, right side: Secondary | ICD-10-CM | POA: Diagnosis not present

## 2021-10-11 DIAGNOSIS — M62830 Muscle spasm of back: Secondary | ICD-10-CM | POA: Diagnosis not present

## 2021-10-30 DIAGNOSIS — M62830 Muscle spasm of back: Secondary | ICD-10-CM | POA: Diagnosis not present

## 2021-10-30 DIAGNOSIS — M9906 Segmental and somatic dysfunction of lower extremity: Secondary | ICD-10-CM | POA: Diagnosis not present

## 2021-10-30 DIAGNOSIS — M9903 Segmental and somatic dysfunction of lumbar region: Secondary | ICD-10-CM | POA: Diagnosis not present

## 2021-10-30 DIAGNOSIS — M5441 Lumbago with sciatica, right side: Secondary | ICD-10-CM | POA: Diagnosis not present

## 2021-11-22 DIAGNOSIS — F900 Attention-deficit hyperactivity disorder, predominantly inattentive type: Secondary | ICD-10-CM | POA: Diagnosis not present

## 2021-11-22 DIAGNOSIS — F5101 Primary insomnia: Secondary | ICD-10-CM | POA: Diagnosis not present

## 2021-11-22 DIAGNOSIS — F4321 Adjustment disorder with depressed mood: Secondary | ICD-10-CM | POA: Diagnosis not present

## 2021-12-15 ENCOUNTER — Ambulatory Visit (INDEPENDENT_AMBULATORY_CARE_PROVIDER_SITE_OTHER): Payer: BC Managed Care – PPO

## 2021-12-15 ENCOUNTER — Encounter (HOSPITAL_COMMUNITY): Payer: Self-pay

## 2021-12-15 ENCOUNTER — Ambulatory Visit (HOSPITAL_COMMUNITY)
Admission: RE | Admit: 2021-12-15 | Discharge: 2021-12-15 | Disposition: A | Discharge status: Home / Self Care | Payer: BC Managed Care – PPO | Source: Ambulatory Visit | Attending: Emergency Medicine | Admitting: Emergency Medicine

## 2021-12-15 ENCOUNTER — Other Ambulatory Visit: Payer: Self-pay

## 2021-12-15 VITALS — BP 120/79 | HR 58 | Temp 97.8°F | Resp 20

## 2021-12-15 DIAGNOSIS — R051 Acute cough: Secondary | ICD-10-CM | POA: Diagnosis not present

## 2021-12-15 DIAGNOSIS — R059 Cough, unspecified: Secondary | ICD-10-CM

## 2021-12-15 DIAGNOSIS — R053 Chronic cough: Secondary | ICD-10-CM

## 2021-12-15 MED ORDER — DOXYCYCLINE HYCLATE 100 MG PO CAPS: 100.0000 mg | ORAL_CAPSULE | Freq: Two times a day (BID) | ORAL | 0 refills | Status: AC

## 2021-12-15 NOTE — ED Triage Notes (Signed)
Pt has an ongoing cough following positive COVID . Pt still has a cough ,congestion,loss of taste and loss of Smell. Pt wants a x-ray.

## 2021-12-15 NOTE — Discharge Instructions (Addendum)
Please take medication as prescribed. Take with food to avoid upset stomach.  Follow up with PCP as needed.

## 2021-12-15 NOTE — ED Provider Notes (Signed)
[facility]    CSN: [id number] Arrival date & time: 12/15/21  1147      History   Chief Complaint Chief Complaint  Patient presents with   Cough    Have had a severe cough for almost 3 weeks. Would like a chest X-ray - Entered by patient   Nasal Congestion    HPI Tracy Cruz is a 41 y.o. female.  Presents with 3-week history of cough Positive COVID at start of symptoms, reports cough has persisted. Productive. Nasal congestion persisting.  Loss of taste and smell Symptoms have not worsened but have not improved Denies fevers over the last few days Requesting chest x-ray today  Non-smoker No known history of lung problems  History reviewed. No pertinent past medical history.  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 No pertinent surgical history.  OB History   No obstetric history on file.      Home Medications    Prior to Admission medications   Medication Sig Start Date End Date Taking? Authorizing Provider  doxycycline (VIBRAMYCIN) 100 MG capsule Take 1 capsule (100 mg total) by mouth 2 (two) times daily for 5 days. 12/15/21 12/20/21 Yes Jessenya Berdan, PA-C  ADDERALL XR 20 MG 24 hr capsule Take 20 mg by mouth every morning. 12/25/18   [provider]  amphetamine-dextroamphetamine (ADDERALL) 20 MG tablet Take 20 mg by mouth daily. 12/25/18   [provider]  benzonatate (TESSALON) 100 MG capsule Take 1 capsule (100 mg total) by mouth every 8 (eight) hours. 06/26/21   Ward, Tylene Fantasia, PA-C  traZODone (DESYREL) 50 MG tablet Take 50-100 mg by mouth at bedtime. 09/16/18   [provider]    Family History Family History  Problem Relation Age of Onset   Healthy Mother    Healthy Father     Social History Social History   Tobacco Use   Smoking status: Never   Smokeless tobacco: Never  Vaping Use   Vaping Use: Some days  Substance Use Topics   Alcohol  use: Yes   Drug use: Never     Allergies   Patient has no known allergies.   Review of Systems Review of Systems  Respiratory:  Positive for cough.    Per HPI  Physical Exam Triage Vital Signs ED Triage Vitals [12/15/21 1228]  Enc Vitals Group     BP      Pulse      Resp      Temp      Temp src      SpO2      Weight      Height      Head Circumference      Peak Flow      Pain Score 0     Pain Loc      Pain Edu?      Excl. in GC?    No data found.  Updated Vital Signs BP 120/79   Pulse (!) 58   Temp 97.8 F (36.6 C)   Resp 20   LMP 12/10/2021   SpO2 99%   Physical Exam Vitals and nursing note reviewed.  Constitutional:      General: She is not in acute distress.    Appearance: She is not ill-appearing.  HENT:     Nose: Congestion present.     Mouth/Throat:     Mouth: Mucous membranes are moist.  Pharynx: Uvula midline. No posterior oropharyngeal erythema.     Tonsils: No tonsillar exudate or tonsillar abscesses.  Eyes:     Conjunctiva/sclera: Conjunctivae normal.  Cardiovascular:     Rate and Rhythm: Normal rate and regular rhythm.     Heart sounds: Normal heart sounds.  Pulmonary:     Effort: Pulmonary effort is normal. No respiratory distress.     Breath sounds: Normal breath sounds. No wheezing, rhonchi or rales.  Musculoskeletal:     Cervical back: Normal range of motion.  Lymphadenopathy:     Cervical: No cervical adenopathy.  Neurological:     Mental Status: She is alert and oriented to person, place, and time.     UC Treatments / Results  Labs (all labs ordered are listed, but only abnormal results are displayed) Labs Reviewed - No data to display  EKG   Radiology DG Chest 2 View  Result Date: 12/15/2021 CLINICAL DATA:  cough x 3 weeks post covid EXAM: CHEST - 2 VIEW COMPARISON:  None Available. FINDINGS: The cardiomediastinal silhouette is within normal limits. No pleural effusion. No pneumothorax. No mass or  consolidation. No acute osseous abnormality. IMPRESSION: Normal chest radiograph. Electronically Signed   By: Albin Felling M.D.   On: 12/15/2021 12:45    Procedures Procedures (including critical care time)  Medications Ordered in UC Medications - No data to display  Initial Impression / Assessment and Plan / UC Course  I have reviewed the triage vital signs and the nursing notes.  Pertinent labs & imaging results that were available during my care of the patient were reviewed by me and considered in my medical decision making (see chart for details).  Chest x-ray negative With persistence of symptoms x 3 weeks will treat with monotherapy doxy 100 mg BID x 5 days. Added to PCP assistance list, will establish and follow up as needed ED precautions. Patient agrees to plan  Final Clinical Impressions(s) / UC Diagnoses   Final diagnoses:  Acute cough  Persistent cough for 3 weeks or longer     Discharge Instructions      Please take medication as prescribed. Take with food to avoid upset stomach.  Follow up with PCP as needed.     ED Prescriptions     Medication Sig Dispense Auth. Provider   doxycycline (VIBRAMYCIN) 100 MG capsule Take 1 capsule (100 mg total) by mouth 2 (two) times daily for 5 days. 10 capsule Misbah Hornaday, Wells Guiles, PA-C      PDMP not reviewed this encounter.   Jaken Fregia, Wells Guiles, [place] 12/15/21 1307

## 2021-12-18 ENCOUNTER — Encounter: Payer: Self-pay | Admitting: Emergency Medicine

## 2021-12-25 ENCOUNTER — Encounter (HOSPITAL_COMMUNITY): Payer: Self-pay

## 2021-12-25 ENCOUNTER — Ambulatory Visit (HOSPITAL_COMMUNITY)
Admission: RE | Admit: 2021-12-25 | Discharge: 2021-12-25 | Disposition: A | Discharge status: Home / Self Care | Payer: BC Managed Care – PPO | Source: Ambulatory Visit | Attending: Family Medicine | Admitting: Family Medicine

## 2021-12-25 VITALS — BP 93/78 | HR 86 | Temp 98.3°F | Resp 18

## 2021-12-25 DIAGNOSIS — J0120 Acute ethmoidal sinusitis, unspecified: Secondary | ICD-10-CM | POA: Diagnosis not present

## 2021-12-25 DIAGNOSIS — R053 Chronic cough: Secondary | ICD-10-CM

## 2021-12-25 MED ORDER — AMOXICILLIN-POT CLAVULANATE 875-125 MG PO TABS: 1.0000 | ORAL_TABLET | Freq: Two times a day (BID) | ORAL | 0 refills | Status: AC

## 2021-12-25 NOTE — Discharge Instructions (Signed)
You were seen today for continued upper respiratory symptoms.  I am treating you today for a sinus infection with augmentin twice/day x 10 days.  Please take with food to avoid an upset stomach.  If you continue to feel poorly then please return for re-evaluation.  If you have not already done so, I recommend you make an appointment with a primary care provider at [email or web address]

## 2021-12-25 NOTE — ED Triage Notes (Signed)
Pt c/o cough, congestion, and fatigue for over 3wks. States was seen here on 10/21 and was given antibiotics and felt better until it was completed.

## 2021-12-25 NOTE — ED Provider Notes (Signed)
[facility]    CSN: [id number] Arrival date & time: 12/25/21  1032      History   Chief Complaint Chief Complaint  Patient presents with   Cough    Looking to see someone about possible renewal of prescription written last week. - Entered by patient    HPI Tracy Cruz is a 41 y.o. female.   Patient is here for continued cough with chest/sinus congestion. She has been feeling poorly about 4 weeks, started with having covid.  Was seen here 10/21, chest xray negative.  Given doxy and tessalon perles, which did help.  She did start feeling a lot better.  When she stopped the doxy she started feeling poorly again, and worsening.  She is currently having a dry cough, phlegmy cough.  Major nasal congestion.   She is having sinus pressure, no pain per se.   Ears feel clogged, no pain.  She is using tessalon and nasal spray without much help.   History reviewed. No pertinent past medical history.  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 No pertinent surgical history.  OB History   No obstetric history on file.      Home Medications    Prior to Admission medications   Medication Sig Start Date End Date Taking? Authorizing Provider  ADDERALL XR 20 MG 24 hr capsule Take 20 mg by mouth every morning. 12/25/18   [provider]  amphetamine-dextroamphetamine (ADDERALL) 20 MG tablet Take 20 mg by mouth daily. 12/25/18   [provider]  traZODone (DESYREL) 50 MG tablet Take 50-100 mg by mouth at bedtime. 09/16/18   [provider]    Family History Family History  Problem Relation Age of Onset   Healthy Mother    Healthy Father     Social History Social History   Tobacco Use   Smoking status: Never   Smokeless tobacco: Never  Vaping Use   Vaping Use: Some days  Substance Use Topics   Alcohol use: Yes   Drug use: Never     Allergies   Patient has no known  allergies.   Review of Systems Review of Systems  Constitutional:  Positive for fatigue. Negative for chills and fever.  HENT:  Positive for congestion, rhinorrhea and sinus pressure. Negative for sinus pain and sore throat.   Respiratory:  Positive for cough. Negative for shortness of breath and wheezing.   Cardiovascular: Negative.   Gastrointestinal: Negative.   Genitourinary: Negative.   Musculoskeletal: Negative.      Physical Exam Triage Vital Signs ED Triage Vitals [12/25/21 1111]  Enc Vitals Group     BP 93/78     Pulse Rate 86     Resp 18     Temp 98.3 F (36.8 C)     Temp Source Oral     SpO2 100 %     Weight      Height      Head Circumference      Peak Flow      Pain Score 0     Pain Loc      Pain Edu?      Excl. in GC?    No data found.  Updated Vital Signs BP 93/78 (BP Location: Left Arm)   Pulse 86   Temp 98.3 F (36.8 C) (Oral)   Resp 18   LMP 12/10/2021   SpO2 100%   Visual Acuity Right  Eye Distance:   Left Eye Distance:   Bilateral Distance:    Right Eye Near:   Left Eye Near:    Bilateral Near:     Physical Exam Constitutional:      Appearance: Normal appearance.  HENT:     Head: Normocephalic and atraumatic.     Right Ear: A middle ear effusion is present.     Left Ear: A middle ear effusion is present.     Nose:     Right Sinus: Frontal sinus tenderness present.     Left Sinus: Frontal sinus tenderness present.  Cardiovascular:     Rate and Rhythm: Normal rate.  Pulmonary:     Effort: Pulmonary effort is normal.     Breath sounds: Normal breath sounds.  Musculoskeletal:     Cervical back: Normal range of motion and neck supple. No tenderness.  Lymphadenopathy:     Cervical: No cervical adenopathy.  Skin:    General: Skin is warm.  Neurological:     General: No focal deficit present.     Mental Status: She is alert.  Psychiatric:        Mood and Affect: Mood normal.      UC Treatments / Results  Labs (all labs  ordered are listed, but only abnormal results are displayed) Labs Reviewed - No data to display  EKG   Radiology No results found.  Procedures Procedures (including critical care time)  Medications Ordered in UC Medications - No data to display  Initial Impression / Assessment and Plan / UC Course  I have reviewed the triage vital signs and the nursing notes.  Pertinent labs & imaging results that were available during my care of the patient were reviewed by me and considered in my medical decision making (see chart for details).   Final Clinical Impressions(s) / UC Diagnoses   Final diagnoses:  Chronic cough  Acute non-recurrent ethmoidal sinusitis     Discharge Instructions      You were seen today for continued upper respiratory symptoms.  I am treating you today for a sinus infection with augmentin twice/day x 10 days.  Please take with food to avoid an upset stomach.  If you continue to feel poorly then please return for re-evaluation.  If you have not already done so, I recommend you make an appointment with a primary care provider at [email or web address]     ED Prescriptions     Medication Sig Dispense Auth. Provider   amoxicillin-clavulanate (AUGMENTIN) 875-125 MG tablet Take 1 tablet by mouth every 12 (twelve) hours for 10 days. 20 tablet Rondel Oh, MD      PDMP not reviewed this encounter.   Rondel Oh, MD 12/25/21 1128

## 2022-01-09 DIAGNOSIS — F5101 Primary insomnia: Secondary | ICD-10-CM | POA: Diagnosis not present

## 2022-01-09 DIAGNOSIS — F900 Attention-deficit hyperactivity disorder, predominantly inattentive type: Secondary | ICD-10-CM | POA: Diagnosis not present

## 2022-05-06 DIAGNOSIS — M2011 Hallux valgus (acquired), right foot: Secondary | ICD-10-CM | POA: Diagnosis not present

## 2022-05-06 DIAGNOSIS — M2012 Hallux valgus (acquired), left foot: Secondary | ICD-10-CM | POA: Diagnosis not present

## 2022-05-20 ENCOUNTER — Ambulatory Visit (INDEPENDENT_AMBULATORY_CARE_PROVIDER_SITE_OTHER): Payer: BC Managed Care – PPO

## 2022-05-20 ENCOUNTER — Ambulatory Visit: Payer: BC Managed Care – PPO | Admitting: Podiatry

## 2022-05-20 DIAGNOSIS — M79671 Pain in right foot: Secondary | ICD-10-CM | POA: Diagnosis not present

## 2022-05-20 DIAGNOSIS — M79672 Pain in left foot: Secondary | ICD-10-CM

## 2022-05-20 DIAGNOSIS — M25871 Other specified joint disorders, right ankle and foot: Secondary | ICD-10-CM

## 2022-05-20 MED ORDER — METHYLPREDNISOLONE 4 MG PO TBPK: ORAL_TABLET | ORAL | 0 refills | Status: DC

## 2022-05-20 MED ORDER — MELOXICAM 15 MG PO TABS: 15.0000 mg | ORAL_TABLET | Freq: Every day | ORAL | 1 refills | Status: DC

## 2022-05-20 NOTE — Progress Notes (Signed)
   Chief Complaint  Patient presents with   Fracture    Patient came in today for a right foot fracture bunion , started 4 years ago, rate of pain 7 out of 10, dull pain, throbbing, patient stopped running, [place]; dancer pad, inserts, X-Rays done today    HPI: 42 y.o. female presenting today for evaluation of chronic pain and tenderness to the right forefoot this been ongoing for about 4 years now.  Gradual onset.  Denies a history of injury.  Patient states that severely painful 7/10 almost on a daily basis.  She is very active and exercises.  Pain with exercise, heels, and any activity seems to irritate her foot.  She was seen by Dr. Su Hoff, [facility], and she presents for second opinion.  Currently she has not done anything for treatment  No past medical history on file.  No past surgical history on file.  No Known Allergies   Physical Exam: General: The patient is alert and oriented x3 in no acute distress.  Dermatology: Skin is warm, dry and supple bilateral lower extremities. Negative for open lesions or macerations.  Vascular: Palpable pedal pulses bilaterally. Capillary refill within normal limits.  Negative for any significant edema or erythema  Neurological: Light touch and protective threshold grossly intact  Musculoskeletal Exam: No pedal deformities noted.  There is pain with light palpation to the sesamoidal apparatus of the right foot.  Radiographic Exam B/L feet 05/20/2022:  Normal osseous mineralization. Joint spaces preserved.  Bipartite sesamoid versus tibial sesamoid fracture noted to the right foot.  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  Assessment: 1.  Chronic severe sesamoiditis right foot x 4 years   Plan of Care:  1. Patient evaluated. X-Rays reviewed.  2.  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  Unfortunately this has been ongoing for about 4 years.  She states that there was a time when she was off of her foot for about 6 weeks which seem to calm the pain down but it immediately returned with activity. 3.  Discussed conservative versus surgical management of the chronic tibial sesamoiditis.  Conservatively recommend orthotics to support the medial longitudinal arch of the foot and offload the first MTP 4.  Appointment for custom molded orthotics 5.  Prescription for Medrol Dosepak 6.  Prescription for meloxicam 15 mg daily 7.  Surgically we did discuss the possibility of tibial sesamoid excision, although prior to this I would like to get an MRI of the foot.  MRI ordered RT foot without contrast 8. Will plan to contact the patient after MRI results are available via telephone to review the results.  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       Edrick Kins, DPM [facility]  Dr. Edrick Kins, DPM    [address], [place] [address]                Office [phone]  Fax [phone]

## 2022-06-05 ENCOUNTER — Inpatient Hospital Stay (HOSPITAL_COMMUNITY): Admission: RE | Admit: 2022-06-05 | Payer: BC Managed Care – PPO | Source: Ambulatory Visit

## 2022-06-05 ENCOUNTER — Ambulatory Visit (HOSPITAL_COMMUNITY): Payer: BC Managed Care – PPO

## 2022-06-05 ENCOUNTER — Ambulatory Visit (HOSPITAL_COMMUNITY)
Admission: EM | Admit: 2022-06-05 | Discharge: 2022-06-05 | Disposition: A | Discharge status: Home / Self Care | Payer: BC Managed Care – PPO | Attending: Family Medicine | Admitting: Family Medicine

## 2022-06-05 DIAGNOSIS — L03111 Cellulitis of right axilla: Secondary | ICD-10-CM | POA: Diagnosis not present

## 2022-06-05 DIAGNOSIS — Z792 Long term (current) use of antibiotics: Secondary | ICD-10-CM | POA: Insufficient documentation

## 2022-06-05 DIAGNOSIS — J069 Acute upper respiratory infection, unspecified: Secondary | ICD-10-CM | POA: Diagnosis not present

## 2022-06-05 DIAGNOSIS — R059 Cough, unspecified: Secondary | ICD-10-CM | POA: Diagnosis not present

## 2022-06-05 DIAGNOSIS — Z1152 Encounter for screening for COVID-19: Secondary | ICD-10-CM | POA: Insufficient documentation

## 2022-06-05 DIAGNOSIS — J029 Acute pharyngitis, unspecified: Secondary | ICD-10-CM | POA: Diagnosis not present

## 2022-06-05 MED ORDER — PROMETHAZINE-DM 6.25-15 MG/5ML PO SYRP: 5.0000 mL | ORAL_SOLUTION | Freq: Four times a day (QID) | ORAL | 0 refills | Status: AC | PRN

## 2022-06-05 MED ORDER — BENZONATATE 100 MG PO CAPS: 100.0000 mg | ORAL_CAPSULE | Freq: Three times a day (TID) | ORAL | 0 refills | Status: DC | PRN

## 2022-06-05 MED ORDER — SULFAMETHOXAZOLE-TRIMETHOPRIM 800-160 MG PO TABS: 1.0000 | ORAL_TABLET | Freq: Two times a day (BID) | ORAL | 0 refills | Status: AC

## 2022-06-05 NOTE — ED Provider Notes (Addendum)
[facility]    CSN: [id number] Arrival date & time: 06/05/22  1352      History   Chief Complaint Chief Complaint  Patient presents with   Abscess    HPI Tracy Cruz is a 42 y.o. female.    Abscess  Here for an improving abscess under her right arm.  About 6 days ago she was begun on Keflex for a swelling and redness under her right arm.  This was prescribed virtual visit.  The area has improved and it is much less painful.  It is still a little painful and she wants to make sure that is going to improve all the way, as she is about to be out of the country on vacation starting April 13.  No fever now and no drainage.  She also has had some sore throat and congestion and cough.  No fever or chills or myalgia.  She has had a little diarrhea but no vomiting.  Last menstrual cycle was about March 22.  No known allergies  No past medical history on file.  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  OB History   No obstetric history on file.      Home Medications    Prior to Admission medications   Medication Sig Start Date End Date Taking? Authorizing Provider  ADDERALL XR 20 MG 24 hr capsule Take 20 mg by mouth every morning. 12/25/18  Yes [provider]  meloxicam (MOBIC) 15 MG tablet Take 1 tablet (15 mg total) by mouth daily. 05/20/22 09/17/22 Yes Felecia Shelling, DPM  promethazine-dextromethorphan (PROMETHAZINE-DM) 6.25-15 MG/5ML syrup Take 5 mLs by mouth 4 (four) times daily as needed for cough. 06/05/22  Yes Zenia Resides, MD  sulfamethoxazole-trimethoprim (BACTRIM DS) 800-160 MG tablet Take 1 tablet by mouth 2 (two) times daily for 7 days. 06/05/22 06/12/22 Yes Zenia Resides, MD  traZODone (DESYREL) 50 MG tablet Take 50-100 mg by mouth at bedtime. 09/16/18  Yes [provider]  amphetamine-dextroamphetamine (ADDERALL) 20 MG tablet Take 20 mg by  mouth daily. 12/25/18   [provider]    Family History Family History  Problem Relation Age of Onset   Healthy Mother    Healthy Father     Social History Social History   Tobacco Use   Smoking status: Never   Smokeless tobacco: Never  Vaping Use   Vaping Use: Some days  Substance Use Topics   Alcohol use: Yes   Drug use: Never     Allergies   Patient has no known allergies.   Review of Systems Review of Systems   Physical Exam Triage Vital Signs ED Triage Vitals  Enc Vitals Group     BP 06/05/22 1424 115/75     Pulse Rate 06/05/22 1424 (!) 108     Resp 06/05/22 1424 16     Temp 06/05/22 1424 98 F (36.7 C)     Temp Source 06/05/22 1424 Oral     SpO2 06/05/22 1424 98 %     Weight --      Height --      Head Circumference --      Peak Flow --      Pain Score 06/05/22 1418 4     Pain Loc --      Pain Edu? --      Excl. in GC? --  No data found.  Updated Vital Signs BP 115/75 (BP Location: Right Arm)   Pulse (!) 108   Temp 98 F (36.7 C) (Oral)   Resp 16   SpO2 98%   Visual Acuity Right Eye Distance:   Left Eye Distance:   Bilateral Distance:    Right Eye Near:   Left Eye Near:    Bilateral Near:     Physical Exam Vitals reviewed.  Constitutional:      General: She is not in acute distress.    Appearance: She is not toxic-appearing.  HENT:     Right Ear: Tympanic membrane and ear canal normal.     Left Ear: Tympanic membrane and ear canal normal.     Nose: Congestion present.     Mouth/Throat:     Mouth: Mucous membranes are moist.     Pharynx: No oropharyngeal exudate or posterior oropharyngeal erythema.  Eyes:     Extraocular Movements: Extraocular movements intact.     Conjunctiva/sclera: Conjunctivae normal.     Pupils: Pupils are equal, round, and reactive to light.  Cardiovascular:     Rate and Rhythm: Normal rate and regular rhythm.     Heart sounds: No murmur heard. Pulmonary:     Effort: Pulmonary effort is  normal. No respiratory distress.     Breath sounds: No stridor. No wheezing, rhonchi or rales.  Musculoskeletal:     Cervical back: Neck supple.  Lymphadenopathy:     Cervical: No cervical adenopathy.  Skin:    Capillary Refill: Capillary refill takes less than 2 seconds.     Coloration: Skin is not jaundiced or pale.     Comments: Axilla there is an area of pink erythema about 2 cm in diameter.  There is no area of fluctuance consistent with an abscess.  It is mildly indurated and mildly tender.  Neurological:     General: No focal deficit present.     Mental Status: She is alert and oriented to person, place, and time.  Psychiatric:        Behavior: Behavior normal.      UC Treatments / Results  Labs (all labs ordered are listed, but only abnormal results are displayed) Labs Reviewed  SARS CORONAVIRUS 2 (TAT 6-24 HRS)    EKG   Radiology No results found.  Procedures Procedures (including critical care time)  Medications Ordered in UC Medications - No data to display  Initial Impression / Assessment and Plan / UC Course  I have reviewed the triage vital signs and the nursing notes.  Pertinent labs & imaging results that were available during my care of the patient were reviewed by me and considered in my medical decision making (see chart for details).        I think the Keflex has done its job treating her cellulitis.  She is going to finish that.  I am going to send in Bactrim as an alternative.  If while she is out of town the area starts getting more inflamed in her right arm, she will start taking that.  COVID swab was done today, and if positive she will know she needs to quarantine.  She is already on day 4 of symptoms.  Tessalon Perles are sent in for cough  At dc, pt requested not to take tessalon perles due to not much relief when took them before. Phenergan with DM sent instead Final Clinical Impressions(s) / UC Diagnoses   Final diagnoses:   Cellulitis of right axilla  Viral upper respiratory tract infection     Discharge Instructions      Take benzonatate 100 mg, 1 tab every 8 hours as needed for cough.   You have been swabbed for COVID, and the test will result in the next 24 hours. Our staff will call you if positive. If the COVID test is positive, you should quarantine until you are fever free for 24 hours and you are starting to feel better, and then take added precautions for the next 5 days, such as physical distancing/wearing a mask and good hand hygiene/washing.  If the area under your right arm begins to swell and be more painful again, can start taking:               Sulfa meth oxazole-trimethoprim 800-160 mg--1 tablet 2 times daily for 7 days.      ED Prescriptions     Medication Sig Dispense Auth. Provider   sulfamethoxazole-trimethoprim (BACTRIM DS) 800-160 MG tablet Take 1 tablet by mouth 2 (two) times daily for 7 days. 14 tablet Cristan Scherzer, Janace ArisPamela K, MD   benzonatate (TESSALON) 100 MG capsule  (Status: Discontinued) Take 1 capsule (100 mg total) by mouth 3 (three) times daily as needed for cough. 21 capsule Zenia ResidesBanister, Nilan Iddings K, MD   promethazine-dextromethorphan (PROMETHAZINE-DM) 6.25-15 MG/5ML syrup Take 5 mLs by mouth 4 (four) times daily as needed for cough. 118 mL Zenia ResidesBanister, Ahaan Zobrist K, MD      PDMP not reviewed this encounter.   Zenia ResidesBanister, Kelan Pritt K, MD 06/05/22 1448    Zenia ResidesBanister, Nehan Flaum K, MD 06/05/22 1452    Zenia ResidesBanister, Nashid Pellum K, MD 06/05/22 1452

## 2022-06-05 NOTE — Discharge Instructions (Signed)
Take benzonatate 100 mg, 1 tab every 8 hours as needed for cough.   You have been swabbed for COVID, and the test will result in the next 24 hours. Our staff will call you if positive. If the COVID test is positive, you should quarantine until you are fever free for 24 hours and you are starting to feel better, and then take added precautions for the next 5 days, such as physical distancing/wearing a mask and good hand hygiene/washing.  If the area under your right arm begins to swell and be more painful again, can start taking:               Sulfa meth oxazole-trimethoprim 800-160 mg--1 tablet 2 times daily for 7 days.

## 2022-06-05 NOTE — ED Triage Notes (Signed)
Pt is here for abscess under left arm  x 6days , pt is currently under taking antibiotics , pt also has a sore throat , body aches x 4 days , cough

## 2022-06-06 LAB — SARS CORONAVIRUS 2 (TAT 6-24 HRS): SARS Coronavirus 2: NEGATIVE

## 2022-08-20 DIAGNOSIS — F900 Attention-deficit hyperactivity disorder, predominantly inattentive type: Secondary | ICD-10-CM | POA: Diagnosis not present

## 2022-08-20 DIAGNOSIS — F5101 Primary insomnia: Secondary | ICD-10-CM | POA: Diagnosis not present

## 2023-02-11 DIAGNOSIS — F900 Attention-deficit hyperactivity disorder, predominantly inattentive type: Secondary | ICD-10-CM | POA: Diagnosis not present

## 2023-02-11 DIAGNOSIS — F5101 Primary insomnia: Secondary | ICD-10-CM | POA: Diagnosis not present

## 2023-03-04 DIAGNOSIS — M9906 Segmental and somatic dysfunction of lower extremity: Secondary | ICD-10-CM | POA: Diagnosis not present

## 2023-03-04 DIAGNOSIS — M5441 Lumbago with sciatica, right side: Secondary | ICD-10-CM | POA: Diagnosis not present

## 2023-03-04 DIAGNOSIS — M9903 Segmental and somatic dysfunction of lumbar region: Secondary | ICD-10-CM | POA: Diagnosis not present

## 2023-03-04 DIAGNOSIS — M62830 Muscle spasm of back: Secondary | ICD-10-CM | POA: Diagnosis not present

## 2023-03-06 DIAGNOSIS — M5441 Lumbago with sciatica, right side: Secondary | ICD-10-CM | POA: Diagnosis not present

## 2023-03-06 DIAGNOSIS — M9906 Segmental and somatic dysfunction of lower extremity: Secondary | ICD-10-CM | POA: Diagnosis not present

## 2023-03-06 DIAGNOSIS — M9903 Segmental and somatic dysfunction of lumbar region: Secondary | ICD-10-CM | POA: Diagnosis not present

## 2023-03-06 DIAGNOSIS — M62830 Muscle spasm of back: Secondary | ICD-10-CM | POA: Diagnosis not present

## 2023-03-13 DIAGNOSIS — M9906 Segmental and somatic dysfunction of lower extremity: Secondary | ICD-10-CM | POA: Diagnosis not present

## 2023-03-13 DIAGNOSIS — M9903 Segmental and somatic dysfunction of lumbar region: Secondary | ICD-10-CM | POA: Diagnosis not present

## 2023-03-13 DIAGNOSIS — M62830 Muscle spasm of back: Secondary | ICD-10-CM | POA: Diagnosis not present

## 2023-03-13 DIAGNOSIS — M5441 Lumbago with sciatica, right side: Secondary | ICD-10-CM | POA: Diagnosis not present

## 2023-03-17 DIAGNOSIS — M62830 Muscle spasm of back: Secondary | ICD-10-CM | POA: Diagnosis not present

## 2023-03-17 DIAGNOSIS — M9906 Segmental and somatic dysfunction of lower extremity: Secondary | ICD-10-CM | POA: Diagnosis not present

## 2023-03-17 DIAGNOSIS — M9903 Segmental and somatic dysfunction of lumbar region: Secondary | ICD-10-CM | POA: Diagnosis not present

## 2023-03-17 DIAGNOSIS — M5441 Lumbago with sciatica, right side: Secondary | ICD-10-CM | POA: Diagnosis not present

## 2023-03-25 DIAGNOSIS — M5441 Lumbago with sciatica, right side: Secondary | ICD-10-CM | POA: Diagnosis not present

## 2023-03-25 DIAGNOSIS — M62830 Muscle spasm of back: Secondary | ICD-10-CM | POA: Diagnosis not present

## 2023-03-25 DIAGNOSIS — M9906 Segmental and somatic dysfunction of lower extremity: Secondary | ICD-10-CM | POA: Diagnosis not present

## 2023-03-25 DIAGNOSIS — M9903 Segmental and somatic dysfunction of lumbar region: Secondary | ICD-10-CM | POA: Diagnosis not present

## 2023-03-27 DIAGNOSIS — M62830 Muscle spasm of back: Secondary | ICD-10-CM | POA: Diagnosis not present

## 2023-03-27 DIAGNOSIS — M9903 Segmental and somatic dysfunction of lumbar region: Secondary | ICD-10-CM | POA: Diagnosis not present

## 2023-03-27 DIAGNOSIS — M9906 Segmental and somatic dysfunction of lower extremity: Secondary | ICD-10-CM | POA: Diagnosis not present

## 2023-03-27 DIAGNOSIS — M5441 Lumbago with sciatica, right side: Secondary | ICD-10-CM | POA: Diagnosis not present

## 2023-04-28 ENCOUNTER — Other Ambulatory Visit (INDEPENDENT_AMBULATORY_CARE_PROVIDER_SITE_OTHER): Payer: Self-pay

## 2023-04-28 ENCOUNTER — Ambulatory Visit: Payer: BC Managed Care – PPO | Admitting: Orthopaedic Surgery

## 2023-04-28 DIAGNOSIS — M7632 Iliotibial band syndrome, left leg: Secondary | ICD-10-CM | POA: Diagnosis not present

## 2023-04-28 DIAGNOSIS — M25562 Pain in left knee: Secondary | ICD-10-CM

## 2023-04-28 DIAGNOSIS — G8929 Other chronic pain: Secondary | ICD-10-CM | POA: Diagnosis not present

## 2023-04-28 MED ORDER — DICLOFENAC SODIUM 75 MG PO TBEC: 75.0000 mg | DELAYED_RELEASE_TABLET | Freq: Two times a day (BID) | ORAL | 1 refills | Status: AC | PRN

## 2023-04-28 MED ORDER — METHYLPREDNISOLONE 4 MG PO TABS: ORAL_TABLET | ORAL | 0 refills | Status: AC

## 2023-04-28 NOTE — Progress Notes (Signed)
 The patient is a very pleasant and active 43 year old female who comes in with 43-month history of left knee pain.  She points to the IT band area on the lateral aspect of the left knee as a source of her pain.  She denies any injury.  She said it is painful when she gets up from sitting position.  She has been treated by chiropractor for left IT band pain around the trochanteric area.  It is painful at night.  She denies any groin pain.  She denies any knee swelling or instability symptoms of her left knee.  On examination of her left knee her knee exam is entirely normal in terms of excellent range of motion with no effusion.  Her knee is ligamentously stable.  Her Lachman's and McMurray's exams are negative.  She does have pain along the course of the IT band.  There is also some trochanteric pain.  2 views of the left knee show normal-appearing knee with no effusion and neutral alignment.  The joint space is well-maintained.  She is definitely dealing with left knee IT band pain as well as trochanteric pain.  I think she would benefit from outpatient physical therapy but we will try first a course of medical treatment with a combination of Voltaren gel, diclofenac orally as well as a steroid taper.  She will look at some stretching exercises online.  If things or not getting better my next step would be formal outpatient physical therapy combined with steroid injection.  She agrees with this treatment plan.  If it is not getting better she will let us know.  All questions and concerns were answered and addressed.  I did talk about better more comfortable and firm shoe wear.

## 2023-05-20 DIAGNOSIS — M25552 Pain in left hip: Secondary | ICD-10-CM | POA: Diagnosis not present

## 2023-05-20 DIAGNOSIS — M9903 Segmental and somatic dysfunction of lumbar region: Secondary | ICD-10-CM | POA: Diagnosis not present

## 2023-05-20 DIAGNOSIS — M5137 Other intervertebral disc degeneration, lumbosacral region with discogenic back pain only: Secondary | ICD-10-CM | POA: Diagnosis not present

## 2023-05-20 DIAGNOSIS — M9905 Segmental and somatic dysfunction of pelvic region: Secondary | ICD-10-CM | POA: Diagnosis not present

## 2023-06-18 DIAGNOSIS — M9903 Segmental and somatic dysfunction of lumbar region: Secondary | ICD-10-CM | POA: Diagnosis not present

## 2023-06-18 DIAGNOSIS — M9905 Segmental and somatic dysfunction of pelvic region: Secondary | ICD-10-CM | POA: Diagnosis not present

## 2023-06-18 DIAGNOSIS — M5137 Other intervertebral disc degeneration, lumbosacral region with discogenic back pain only: Secondary | ICD-10-CM | POA: Diagnosis not present

## 2023-06-18 DIAGNOSIS — M25552 Pain in left hip: Secondary | ICD-10-CM | POA: Diagnosis not present

## 2023-07-01 ENCOUNTER — Other Ambulatory Visit: Payer: Self-pay | Admitting: Podiatry

## 2023-07-07 DIAGNOSIS — M25552 Pain in left hip: Secondary | ICD-10-CM | POA: Diagnosis not present

## 2023-07-07 DIAGNOSIS — M5137 Other intervertebral disc degeneration, lumbosacral region with discogenic back pain only: Secondary | ICD-10-CM | POA: Diagnosis not present

## 2023-07-07 DIAGNOSIS — M9903 Segmental and somatic dysfunction of lumbar region: Secondary | ICD-10-CM | POA: Diagnosis not present

## 2023-07-07 DIAGNOSIS — M9905 Segmental and somatic dysfunction of pelvic region: Secondary | ICD-10-CM | POA: Diagnosis not present

## 2023-12-05 DIAGNOSIS — N951 Menopausal and female climacteric states: Secondary | ICD-10-CM | POA: Diagnosis not present

## 2023-12-08 DIAGNOSIS — F900 Attention-deficit hyperactivity disorder, predominantly inattentive type: Secondary | ICD-10-CM | POA: Diagnosis not present

## 2023-12-08 DIAGNOSIS — F5101 Primary insomnia: Secondary | ICD-10-CM | POA: Diagnosis not present

## 2023-12-29 ENCOUNTER — Encounter: Payer: Self-pay | Admitting: Radiology

## 2024-01-12 DIAGNOSIS — L2989 Other pruritus: Secondary | ICD-10-CM | POA: Diagnosis not present

## 2024-01-12 DIAGNOSIS — L814 Other melanin hyperpigmentation: Secondary | ICD-10-CM | POA: Diagnosis not present

## 2024-01-12 DIAGNOSIS — D225 Melanocytic nevi of trunk: Secondary | ICD-10-CM | POA: Diagnosis not present

## 2024-01-12 DIAGNOSIS — D224 Melanocytic nevi of scalp and neck: Secondary | ICD-10-CM | POA: Diagnosis not present
